# Patient Record
Sex: Male | Born: 1990
Health system: Southern US, Community
[De-identification: ages and names within clinical notes are randomized; demographics above are authoritative.]

---

## 2014-10-07 ENCOUNTER — Encounter (HOSPITAL_COMMUNITY): Payer: Self-pay | Admitting: Family Medicine

## 2014-10-07 ENCOUNTER — Emergency Department (HOSPITAL_COMMUNITY)
Admission: EM | Admit: 2014-10-07 | Discharge: 2014-10-07 | Disposition: A | Discharge status: Home / Self Care | Payer: BLUE CROSS/BLUE SHIELD | Attending: Emergency Medicine | Admitting: Emergency Medicine

## 2014-10-07 ENCOUNTER — Emergency Department (HOSPITAL_COMMUNITY): Payer: BLUE CROSS/BLUE SHIELD

## 2014-10-07 DIAGNOSIS — R059 Cough, unspecified: Secondary | ICD-10-CM

## 2014-10-07 DIAGNOSIS — J020 Streptococcal pharyngitis: Secondary | ICD-10-CM | POA: Diagnosis not present

## 2014-10-07 DIAGNOSIS — R05 Cough: Secondary | ICD-10-CM

## 2014-10-07 DIAGNOSIS — J029 Acute pharyngitis, unspecified: Secondary | ICD-10-CM | POA: Diagnosis present

## 2014-10-07 LAB — RAPID STREP SCREEN (MED CTR MEBANE ONLY): Streptococcus, Group A Screen (Direct): POSITIVE — AB

## 2014-10-07 MED ORDER — PENICILLIN G BENZATHINE 1200000 UNIT/2ML IM SUSP
1.2000 10*6.[IU] | Freq: Once | INTRAMUSCULAR | Status: AC
  Administered 2014-10-07: 1.2 10*6.[IU] via INTRAMUSCULAR
  Filled 2014-10-07: qty 2

## 2014-10-07 NOTE — ED Notes (Signed)
Holding Bicillin until CXR resulted per PA request

## 2014-10-07 NOTE — ED Notes (Signed)
PA student at bedside for eval.

## 2014-10-07 NOTE — Discharge Instructions (Signed)

## 2014-10-07 NOTE — ED Notes (Addendum)
Pt states he's had a HA since Friday. No trauma. States he 'gets headaches a lot'. No neuro deficits. Pt states he started with sore throat on Sunday. Sounds congested. Complains of productive cough. States he thinks he had a fever yesterday. Appears in nad. Mucous membranes moist

## 2014-10-07 NOTE — ED Notes (Signed)
Pt here for sorethroat and headache x 3 days.

## 2014-10-07 NOTE — ED Provider Notes (Signed)
CSN: [id number]     Arrival date & time 10/07/14  0801 History  This chart was scribed for non-physician practitioner, Roxy Horseman, PA-C, working with Blane Ohara, MD by Charline Bills, ED Scribe. This patient was seen in room TR07C/TR07C and the patient's care was started at 9:09 AM.   Chief Complaint  Patient presents with  . Sore Throat  . Headache   The history is provided by the patient. No language interpreter was used.   HPI Comments: Martin Parsons is a 24 y.o. male who presents to the Emergency Department with a chief complaint of constant sore throat for the past 3 days. Pt reports associated persistent sinus HA, congestion and cough for the past few days as well. No medications tried PTA. Pt denies fever. No h/o asthma or strep. No sick contacts.   History reviewed. No pertinent past medical history. History reviewed. No pertinent past surgical history. History reviewed. No pertinent family history. History  Substance Use Topics  . Smoking status: Never Smoker   . Smokeless tobacco: Not on file  . Alcohol Use: Not on file    Review of Systems  Constitutional: Negative for fever.  HENT: Positive for congestion and sore throat.   Respiratory: Positive for cough.   Neurological: Positive for headaches.   Allergies  Review of patient's allergies indicates no known allergies.  Home Medications   Prior to Admission medications   Not on File   BP 136/77 mmHg  Pulse 84  Temp(Src) 98 F (36.7 C)  Resp 18  Ht 5\' 11"  (1.803 m)  Wt 192 lb (87.091 kg)  BMI 26.79 kg/m2  SpO2 98% Physical Exam  Constitutional: He is oriented to person, place, and time. He appears well-developed and well-nourished. No distress.  HENT:  Head: Normocephalic and atraumatic.  Mouth/Throat: Posterior oropharyngeal erythema (moderate) present. No oropharyngeal exudate or tonsillar abscesses.  Eyes: Conjunctivae and EOM are normal.  Neck: Neck supple. No tracheal deviation present.   Cardiovascular: Normal rate, regular rhythm and normal heart sounds.   Pulmonary/Chest: Effort normal and breath sounds normal. No respiratory distress.  Crackles present  Musculoskeletal: Normal range of motion.  Neurological: He is alert and oriented to person, place, and time.  Skin: Skin is warm and dry.  Psychiatric: He has a normal mood and affect. His behavior is normal.  Nursing note and vitals reviewed.  ED Course  Procedures (including critical care time) DIAGNOSTIC STUDIES: Oxygen Saturation is 98% on RA, normal by my interpretation.    COORDINATION OF CARE: 9:12 AM-Discussed treatment plan which includes strep screen, CXR and Bicillin LA injection with pt at bedside and pt agreed to plan.   Labs Review Labs Reviewed  RAPID STREP SCREEN (NOT AT [facility]) - Abnormal; Notable for the following:    Streptococcus, Group A Screen (Direct) POSITIVE (*)    All other components within normal limits   Imaging Review Dg Chest 2 View  10/07/2014   CLINICAL DATA:  Cough and sore throat for 45 days  EXAM: CHEST - 2 VIEW  COMPARISON:  None.  FINDINGS: The heart size and mediastinal contours are within normal limits. Both lungs are clear. The visualized skeletal structures are unremarkable.  IMPRESSION: No active disease.   Electronically Signed   By: Alcide Clever M.D.   On: 10/07/2014 09:32    EKG Interpretation None      MDM   Final diagnoses:  Cough  Strep throat    Patient with strep throat.  Treated with pen  IM.  Return precautions given.  Patient is stable and ready for discharge.  I personally performed the services described in this documentation, which was scribed in my presence. The recorded information has been reviewed and is accurate.     Roxy Horseman, PA-C 10/07/14 1106  Blane Ohara, MD 10/13/14 1520

## 2016-07-19 IMAGING — DX DG CHEST 2V
2 series · 2 of 2 positions shown · non-contrast
Comparison: None.

CLINICAL DATA: Cough and sore throat for 45 days

EXAM:
CHEST - 2 VIEW

[chest pa]
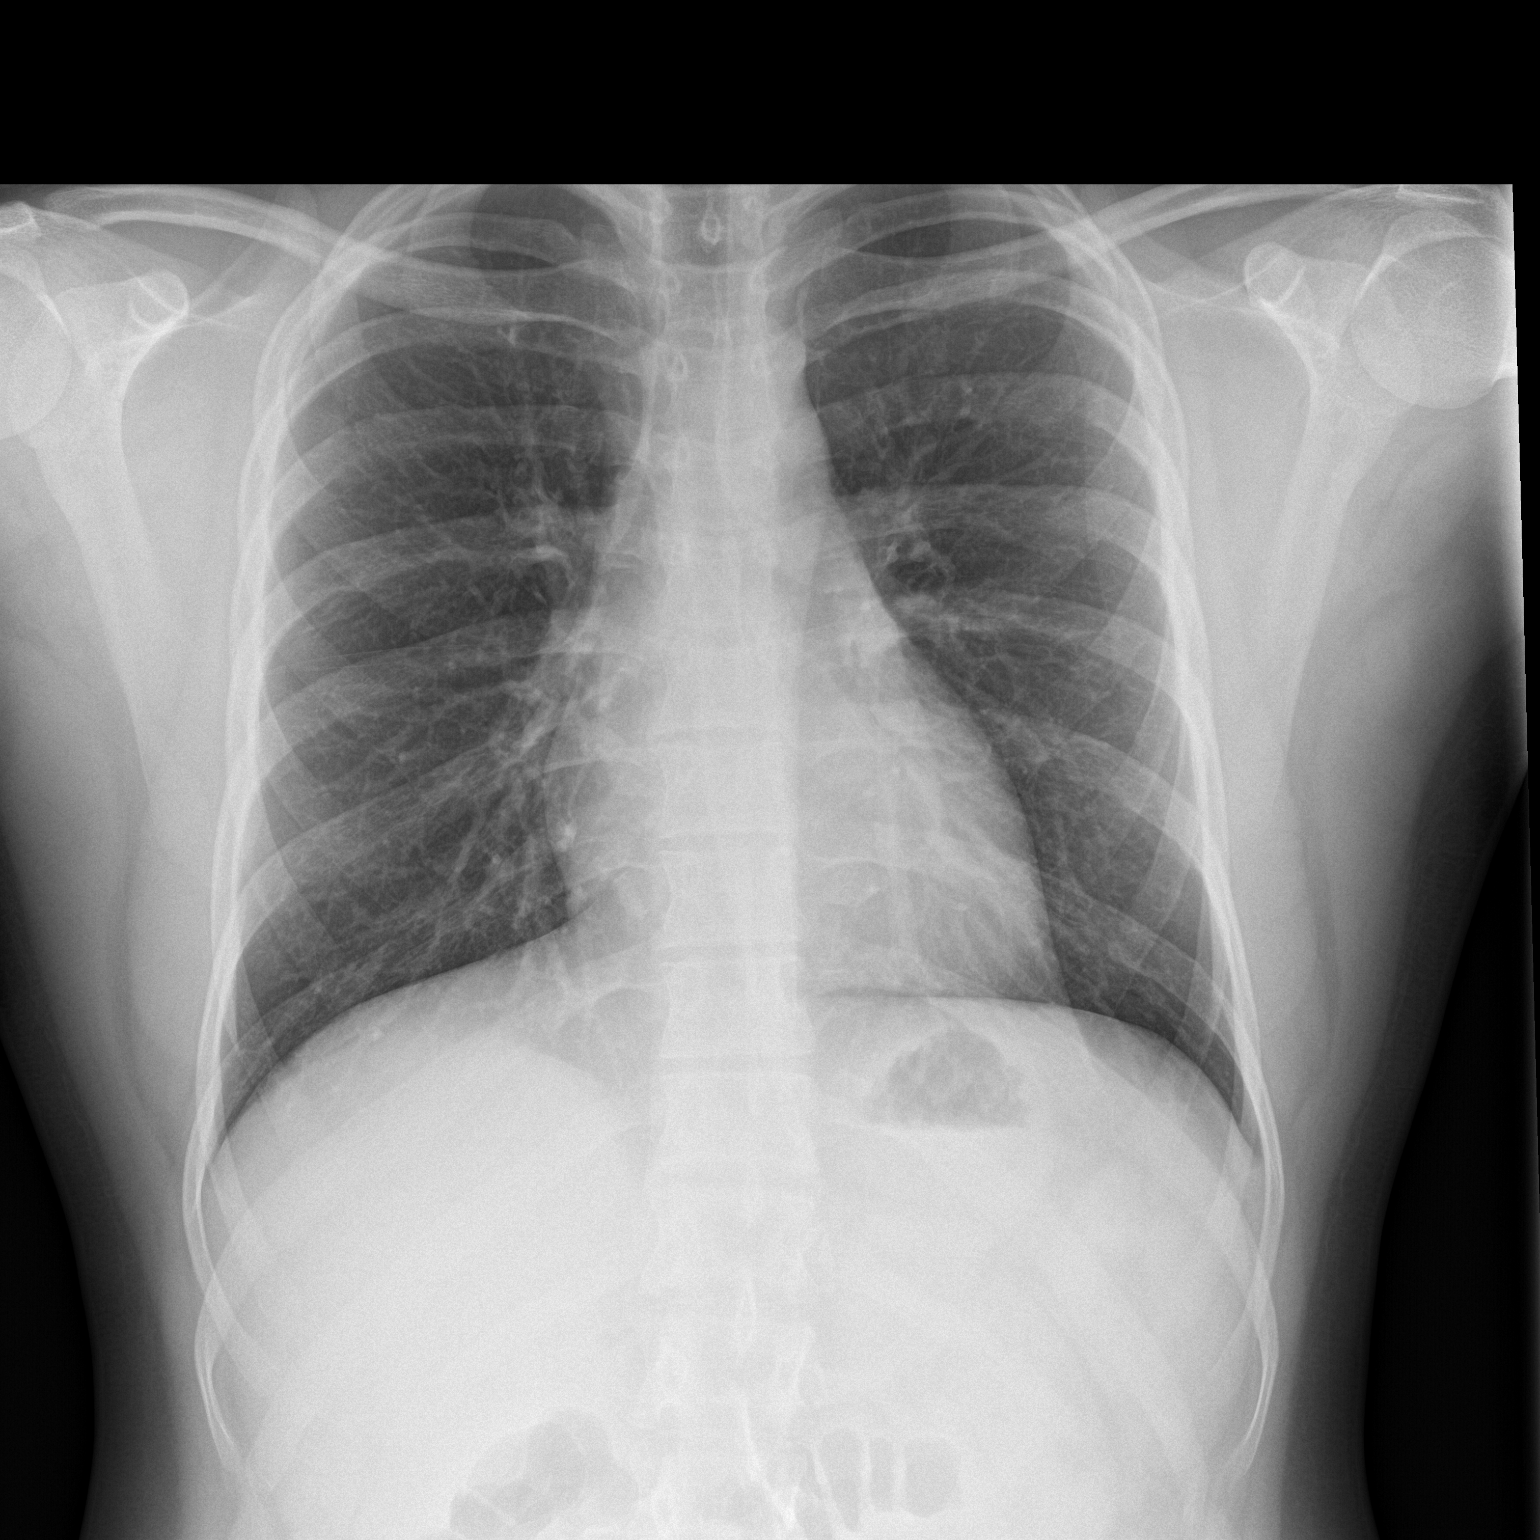

[chest lat]
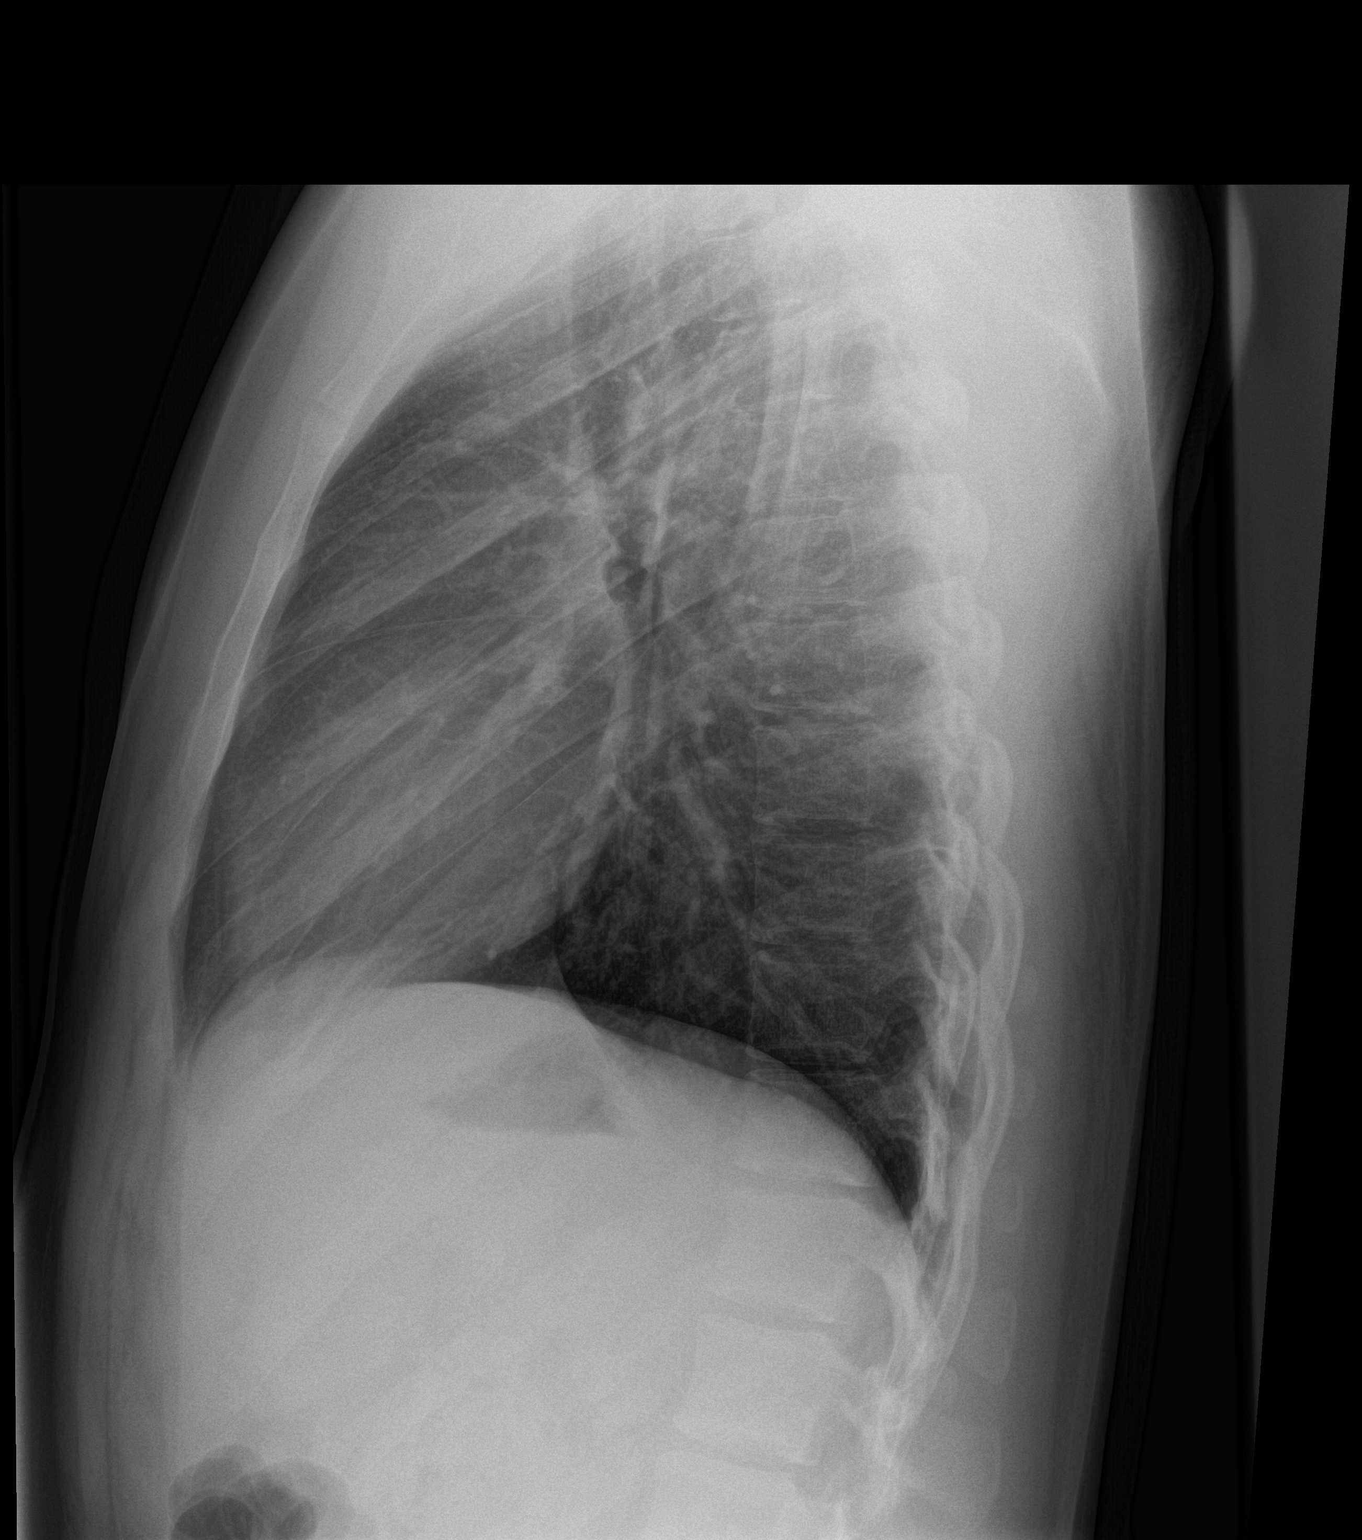

[2 of 2 positions shown; findings below may reference images not displayed]

FINDINGS: The heart size and mediastinal contours are within normal limits.
Both lungs are clear. The visualized skeletal structures are
unremarkable.
IMPRESSION: No active disease.

## 2018-12-18 ENCOUNTER — Other Ambulatory Visit: Payer: Self-pay

## 2018-12-18 DIAGNOSIS — Z20822 Contact with and (suspected) exposure to covid-19: Secondary | ICD-10-CM

## 2018-12-20 LAB — NOVEL CORONAVIRUS, NAA: SARS-CoV-2, NAA: NOT DETECTED
